# Patient Record
Sex: Male | Born: 1980 | Race: Black or African American | Hispanic: No | Marital: Single | Smoking: Former smoker
Health system: Southern US, Community
[De-identification: ages and names within clinical notes are randomized; demographics above are authoritative.]

## PROBLEM LIST (undated history)

## (undated) DIAGNOSIS — I10 Essential (primary) hypertension: Secondary | ICD-10-CM

---

## 2009-10-31 HISTORY — PX: ABDOMINAL SURGERY: SHX537

## 2009-10-31 HISTORY — PX: OTHER SURGICAL HISTORY: SHX169

## 2014-07-30 ENCOUNTER — Encounter (HOSPITAL_COMMUNITY): Payer: Self-pay | Admitting: Emergency Medicine

## 2014-07-30 ENCOUNTER — Emergency Department (INDEPENDENT_AMBULATORY_CARE_PROVIDER_SITE_OTHER)
Admission: EM | Admit: 2014-07-30 | Discharge: 2014-07-30 | Disposition: A | Discharge status: Home / Self Care | Payer: Self-pay | Source: Home / Self Care | Attending: Emergency Medicine | Admitting: Emergency Medicine

## 2014-07-30 DIAGNOSIS — I10 Essential (primary) hypertension: Secondary | ICD-10-CM

## 2014-07-30 HISTORY — DX: Essential (primary) hypertension: I10

## 2014-07-30 MED ORDER — LISINOPRIL 10 MG PO TABS: 10.0000 mg | ORAL_TABLET | Freq: Every day | ORAL | Status: DC

## 2014-07-30 MED ORDER — HYDROCHLOROTHIAZIDE 25 MG PO TABS: 25.0000 mg | ORAL_TABLET | Freq: Every day | ORAL | Status: DC

## 2014-07-30 NOTE — Discharge Instructions (Signed)
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
## 2014-07-30 NOTE — ED Notes (Signed)
Resident of [facility].  Ran out of BP medicine medicine 2 days ago.

## 2014-07-30 NOTE — ED Provider Notes (Signed)
Medical screening examination/treatment/procedure(s) were performed by non-physician practitioner and as supervising physician I was immediately available for consultation/collaboration.  Leslee Homeavid Walt Geathers, M.D.  Reuben Likesavid C Ludie Hudon, MD 07/30/14 2206

## 2014-09-05 ENCOUNTER — Emergency Department (INDEPENDENT_AMBULATORY_CARE_PROVIDER_SITE_OTHER)
Admission: EM | Admit: 2014-09-05 | Discharge: 2014-09-05 | Disposition: A | Discharge status: Nursing Home (Includes ICF) | Payer: Self-pay | Source: Home / Self Care | Attending: Family Medicine | Admitting: Family Medicine

## 2014-09-05 ENCOUNTER — Emergency Department (HOSPITAL_COMMUNITY)
Admission: EM | Admit: 2014-09-05 | Discharge: 2014-09-06 | Disposition: A | Discharge status: Home / Self Care | Payer: Self-pay | Attending: Emergency Medicine | Admitting: Emergency Medicine

## 2014-09-05 ENCOUNTER — Encounter (HOSPITAL_COMMUNITY): Payer: Self-pay | Admitting: *Deleted

## 2014-09-05 ENCOUNTER — Emergency Department (HOSPITAL_COMMUNITY): Payer: Self-pay

## 2014-09-05 ENCOUNTER — Encounter (HOSPITAL_COMMUNITY): Payer: Self-pay | Admitting: Emergency Medicine

## 2014-09-05 DIAGNOSIS — M958 Other specified acquired deformities of musculoskeletal system: Secondary | ICD-10-CM

## 2014-09-05 DIAGNOSIS — R52 Pain, unspecified: Secondary | ICD-10-CM

## 2014-09-05 DIAGNOSIS — R1031 Right lower quadrant pain: Secondary | ICD-10-CM

## 2014-09-05 DIAGNOSIS — M79632 Pain in left forearm: Secondary | ICD-10-CM

## 2014-09-05 DIAGNOSIS — I10 Essential (primary) hypertension: Secondary | ICD-10-CM

## 2014-09-05 DIAGNOSIS — S51832S Puncture wound without foreign body of left forearm, sequela: Secondary | ICD-10-CM

## 2014-09-05 DIAGNOSIS — Z7901 Long term (current) use of anticoagulants: Secondary | ICD-10-CM | POA: Insufficient documentation

## 2014-09-05 DIAGNOSIS — IMO0002 Reserved for concepts with insufficient information to code with codable children: Secondary | ICD-10-CM

## 2014-09-05 DIAGNOSIS — M79602 Pain in left arm: Secondary | ICD-10-CM

## 2014-09-05 DIAGNOSIS — F19230 Other psychoactive substance dependence with withdrawal, uncomplicated: Secondary | ICD-10-CM

## 2014-09-05 DIAGNOSIS — F19930 Other psychoactive substance use, unspecified with withdrawal, uncomplicated: Secondary | ICD-10-CM

## 2014-09-05 DIAGNOSIS — Z87891 Personal history of nicotine dependence: Secondary | ICD-10-CM | POA: Insufficient documentation

## 2014-09-05 DIAGNOSIS — S31139S Puncture wound of abdominal wall without foreign body, unspecified quadrant without penetration into peritoneal cavity, sequela: Secondary | ICD-10-CM

## 2014-09-05 DIAGNOSIS — W3400XS Accidental discharge from unspecified firearms or gun, sequela: Secondary | ICD-10-CM

## 2014-09-05 DIAGNOSIS — Y249XXS Unspecified firearm discharge, undetermined intent, sequela: Secondary | ICD-10-CM | POA: Insufficient documentation

## 2014-09-05 DIAGNOSIS — Z79899 Other long term (current) drug therapy: Secondary | ICD-10-CM | POA: Insufficient documentation

## 2014-09-05 DIAGNOSIS — S51002S Unspecified open wound of left elbow, sequela: Secondary | ICD-10-CM | POA: Insufficient documentation

## 2014-09-05 DIAGNOSIS — S31603S Unspecified open wound of abdominal wall, right lower quadrant with penetration into peritoneal cavity, sequela: Secondary | ICD-10-CM | POA: Insufficient documentation

## 2014-09-05 LAB — COMPREHENSIVE METABOLIC PANEL
ALK PHOS: 68 U/L (ref 39–117)
ALT: 13 U/L (ref 0–53)
AST: 21 U/L (ref 0–37)
Albumin: 3.9 g/dL (ref 3.5–5.2)
Anion gap: 14 (ref 5–15)
BUN: 10 mg/dL (ref 6–23)
CO2: 23 mEq/L (ref 19–32)
Calcium: 9.7 mg/dL (ref 8.4–10.5)
Chloride: 103 mEq/L (ref 96–112)
Creatinine, Ser: 1 mg/dL (ref 0.50–1.35)
GFR calc non Af Amer: >90 mL/min (ref >90)
GLUCOSE: 131 mg/dL — AB (ref 70–99)
POTASSIUM: 4.1 meq/L (ref 3.7–5.3)
Sodium: 140 mEq/L (ref 137–147)
TOTAL PROTEIN: 7.3 g/dL (ref 6.0–8.3)
Total Bilirubin: 0.5 mg/dL (ref 0.3–1.2)

## 2014-09-05 LAB — CBC WITH DIFFERENTIAL/PLATELET
BASOS ABS: 0 10*3/uL (ref 0.0–0.1)
Basophils Relative: 0 % (ref 0–1)
Eosinophils Absolute: 0.2 10*3/uL (ref 0.0–0.7)
Eosinophils Relative: 3 % (ref 0–5)
HCT: 39.1 % (ref 39.0–52.0)
Hemoglobin: 13.7 g/dL (ref 13.0–17.0)
LYMPHS ABS: 2.6 10*3/uL (ref 0.7–4.0)
LYMPHS PCT: 37 % (ref 12–46)
MCH: 28.4 pg (ref 26.0–34.0)
MCHC: 35 g/dL (ref 30.0–36.0)
MCV: 81.1 fL (ref 78.0–100.0)
Monocytes Absolute: 0.6 10*3/uL (ref 0.1–1.0)
Monocytes Relative: 8 % (ref 3–12)
NEUTROS PCT: 52 % (ref 43–77)
Neutro Abs: 3.6 10*3/uL (ref 1.7–7.7)
PLATELETS: 246 10*3/uL (ref 150–400)
RBC: 4.82 MIL/uL (ref 4.22–5.81)
RDW: 12.9 % (ref 11.5–15.5)
WBC: 6.9 10*3/uL (ref 4.0–10.5)

## 2014-09-05 LAB — URINALYSIS, ROUTINE W REFLEX MICROSCOPIC
Bilirubin Urine: NEGATIVE
Glucose, UA: NEGATIVE mg/dL
HGB URINE DIPSTICK: NEGATIVE
Ketones, ur: NEGATIVE mg/dL
Leukocytes, UA: NEGATIVE
Nitrite: NEGATIVE
PH: 5.5 (ref 5.0–8.0)
Protein, ur: NEGATIVE mg/dL
Specific Gravity, Urine: 1.034 — ABNORMAL HIGH (ref 1.005–1.030)
UROBILINOGEN UA: 1 mg/dL (ref 0.0–1.0)

## 2014-09-05 MED ORDER — HYDROCODONE-ACETAMINOPHEN 5-325 MG PO TABS: 1.0000 | ORAL_TABLET | Freq: Once | ORAL | Status: DC

## 2014-09-05 MED ORDER — OXYCODONE-ACETAMINOPHEN 5-325 MG PO TABS
1.0000 | ORAL_TABLET | Freq: Once | ORAL | Status: AC
  Administered 2014-09-05: 1 via ORAL
  Filled 2014-09-05: qty 1

## 2014-09-05 MED ORDER — LISINOPRIL-HYDROCHLOROTHIAZIDE 10-12.5 MG PO TABS: 1.0000 | ORAL_TABLET | Freq: Every day | ORAL | Status: DC

## 2014-09-05 MED ORDER — IBUPROFEN 800 MG PO TABS: 800.0000 mg | ORAL_TABLET | Freq: Three times a day (TID) | ORAL | Status: DC | PRN

## 2014-09-05 MED ORDER — HYDROCODONE-ACETAMINOPHEN 5-325 MG PO TABS: 1.0000 | ORAL_TABLET | ORAL | Status: DC | PRN

## 2014-09-05 NOTE — ED Notes (Signed)
Discussed with patient that urine sample is needed.

## 2014-09-05 NOTE — Discharge Instructions (Signed)
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
## 2014-09-06 NOTE — Discharge Instructions (Signed)
Read the information below.  Use the prescribed medication as directed.  Please discuss all new medications with your pharmacist.  Do not take additional tylenol while taking the prescribed pain medication to avoid overdose.  You may return to the Emergency Department at any time for worsening condition or any new symptoms that concern you.  If you develop high fevers, worsening abdominal pain, uncontrolled vomiting, if you cannot pass gas, or are unable to tolerate fluids by mouth, return to the ER for a recheck.  If you develop uncontrolled pain, weakness or numbness of the extremity, severe discoloration of the skin, or you are unable to move your hand or use your arm, return to the ER for a recheck.      Abdominal Pain Many things can cause abdominal pain. Usually, abdominal pain is not caused by a disease and will improve without treatment. It can often be observed and treated at home. Your health care provider will do a physical exam and possibly order blood tests and X-rays to help determine the seriousness of your pain. However, in many cases, more time must pass before a clear cause of the pain can be found. Before that point, your health care provider may not know if you need more testing or further treatment. HOME CARE INSTRUCTIONS  Monitor your abdominal pain for any changes. The following actions may help to alleviate any discomfort you are experiencing:  Only take over-the-counter or prescription medicines as directed by your health care provider.  Do not take laxatives unless directed to do so by your health care provider.  Try a clear liquid diet (broth, tea, or water) as directed by your health care provider. Slowly move to a bland diet as tolerated. SEEK MEDICAL CARE IF:  You have unexplained abdominal pain.  You have abdominal pain associated with nausea or diarrhea.  You have pain when you urinate or have a bowel movement.  You experience abdominal pain that wakes you in the  night.  You have abdominal pain that is worsened or improved by eating food.  You have abdominal pain that is worsened with eating fatty foods.  You have a fever. SEEK IMMEDIATE MEDICAL CARE IF:   Your pain does not go away within 2 hours.  You keep throwing up (vomiting).  Your pain is felt only in portions of the abdomen, such as the right side or the left lower portion of the abdomen.  You pass bloody or black tarry stools. MAKE SURE YOU:  Understand these instructions.   Will watch your condition.   Will get help right away if you are not doing well or get worse.  Document Released: 07/27/2005 Document Revised: 10/22/2013 Document Reviewed: 06/26/2013 [facility] Patient Information 2015 [place], [place]. This information is not intended to replace advice given to you by your health care provider. Make sure you discuss any questions you have with your health care provider.  Musculoskeletal Pain Musculoskeletal pain is muscle and boney aches and pains. These pains can occur in any part of the body. Your caregiver may treat you without knowing the cause of the pain. They may treat you if blood or urine tests, X-rays, and other tests were normal.  CAUSES There is often not a definite cause or reason for these pains. These pains may be caused by a type of germ (virus). The discomfort may also come from overuse. Overuse includes working out too hard when your body is not fit. Boney aches also come from weather changes. Bone is  sensitive to atmospheric pressure changes. HOME CARE INSTRUCTIONS   Ask when your test results will be ready. Make sure you get your test results.  Only take over-the-counter or prescription medicines for pain, discomfort, or fever as directed by your caregiver. If you were given medications for your condition, do not drive, operate machinery or power tools, or sign legal documents for 24 hours. Do not drink alcohol. Do not take sleeping pills or other  medications that may interfere with treatment.  Continue all activities unless the activities cause more pain. When the pain lessens, slowly resume normal activities. Gradually increase the intensity and duration of the activities or exercise.  During periods of severe pain, bed rest may be helpful. Lay or sit in any position that is comfortable.  Putting ice on the injured area.  Put ice in a bag.  Place a towel between your skin and the bag.  Leave the ice on for 15 to 20 minutes, 3 to 4 times a day.  Follow up with your caregiver for continued problems and no reason can be found for the pain. If the pain becomes worse or does not go away, it may be necessary to repeat tests or do additional testing. Your caregiver may need to look further for a possible cause. SEEK IMMEDIATE MEDICAL CARE IF:  You have pain that is getting worse and is not relieved by medications.  You develop chest pain that is associated with shortness or breath, sweating, feeling sick to your stomach (nauseous), or throw up (vomit).  Your pain becomes localized to the abdomen.  You develop any new symptoms that seem different or that concern you. MAKE SURE YOU:   Understand these instructions.  Will watch your condition.  Will get help right away if you are not doing well or get worse. Document Released: 10/17/2005 Document Revised: 01/09/2012 Document Reviewed: 06/21/2013 [facility] Patient Information 2015 [place], [place]. This information is not intended to replace advice given to you by your health care provider. Make sure you discuss any questions you have with your health care provider.    Emergency Department Resource Guide 1) Find a Doctor and Pay Out of Pocket Although you won't have to find out who is covered by your insurance plan, it is a good idea to ask around and get recommendations. You will then need to call the office and see if the doctor you have chosen will accept you as a new patient and  what types of options they offer for patients who are self-pay. Some doctors offer discounts or will set up payment plans for their patients who do not have insurance, but you will need to ask so you aren't surprised when you get to your appointment.  2) Contact Your Local Health Department Not all health departments have doctors that can see patients for sick visits, but many do, so it is worth a call to see if yours does. If you don't know where your local health department is, you can check in your phone book. The CDC also has a tool to help you locate your state's health department, and many state websites also have listings of all of their local health departments.  3) Find a Walk-in Clinic If your illness is not likely to be very severe or complicated, you may want to try a walk in clinic. These are popping up all over the country in pharmacies, drugstores, and shopping centers. They're usually staffed by nurse practitioners or physician assistants that have been trained  to treat common illnesses and complaints. They're usually fairly quick and inexpensive. However, if you have serious medical issues or chronic medical problems, these are probably not your best option.  No Primary Care Doctor: - Call Health Connect at  [phone] - they can help you locate a primary care doctor that  accepts your insurance, provides certain services, etc. - Physician Referral Service- [phone]  Chronic Pain Problems: Organization         Address  Phone   Notes  [facility]  [phone]) [phone] Patients need to be referred by their primary care doctor.   Medication Assistance: Organization         Address  Phone   Notes  [facility] Medication [facility] [address] [place], [place] [address] [phone] --Must be a resident of [facility] -- Must have NO insurance coverage whatsoever (no Medicaid/ Medicare, etc.) -- The pt. MUST have a primary care doctor  that directs their care regularly and follows them in the community   MedAssist  [phone]) [phone]   [employer]  [phone]    Agencies that provide inexpensive medical care: Organization         Address  Phone   Notes  [facility]  [phone]   [facility]    [phone]   [facility] [address] [place], [place] [address] [phone]   [facility] [address], [place] [phone]   Planned Parenthood    [phone]) [phone]   [facility]    [phone]) [phone]   [facility] and [facility]  [address], [place] Phone:  [phone], Fax:  [phone]) [phone] Hours of Operation:  9 am - 6 pm, M-F.  Also accepts Medicaid/Medicare and self-pay.  [facility]  [address], [place] Phone: [phone], Fax: [phone]. Hours of Operation:  8:30 am - 5:30 pm, M-F.  Also accepts Medicaid and self-pay.  [facility] [place] [address], [place] Phone: [phone]) [phone]   [facility] [address], [place]ra Laffey [place], [place] [phone], Ext. [phone] Mondays & Thursdays: 7-9 AM.  First 15 patients are seen on a first come, first serve basis.    Medicaid-accepting [facility] Providers:  Organization         Address  Phone   Notes  [facility] [address],  [phone] Also accepts self-pay patients.  [facility] [address], [place]  [phone]   [facility] [address], [place] [phone]   [facility] [address] [place] [address], [place] [phone]) [phone]   Renaye RakersVeita Bland [address], [place]   [phone] Only accepts [place] Access [place] patients after they have their name applied to their card.   Self-Pay (no insurance) in [facility]:  Organization          Address  Phone   Notes  Sickle Cell Patients, [facility] [address], [place] [phone]   [facility] [address], [place] [phone]   [facility] [place]  [address], [place] [phone]) [phone]   [facility]/Dr. Osei-Bonsu  [address] [place] [address], [place] or [phone]  [address], [address], [place] [phone] Phone number for both [facility] and [place] locations is the same.  [facility] [address] [place] [address], [place] [phone]   [facility] [address], [place] or [address] [phone]   [facility] [address], [place] [phone], phone; [phone], fax Sees patients 1st and 3rd Saturday of every month.  Must not qualify for public or private insurance (i.e. Medicaid, Medicare, [place] Health Choice, Veterans' Benefits)  Household income should be no more than 200% of the poverty level The clinic cannot treat you if you are pregnant or think you are pregnant  Sexually transmitted diseases are not treated at the clinic.    Dental Care: Organization         Address  Phone  Notes  [facility] of [facility] [address] [place], [place] [phone] Accepts children up to age 94 who are enrolled in [place] or [place] Health Choice; pregnant women with a Medicaid card; and children who have applied for Medicaid or [place] Health Choice, but were declined, whose parents can pay a reduced fee at time of service.  [facility] Department of [facility]  [address], [place] [phone] Accepts children up to age 72 who are enrolled in [place] or [place] Health Choice; pregnant women with a Medicaid card; and children who have applied for Medicaid or [place] Health Choice, but were declined, whose parents can pay a reduced fee at time of  service.  [facility]  [address] [place], [place] [phone] Patients are seen by appointment only. Walk-ins are not accepted. [facility] will see patients 17 years of age and older. Monday - Tuesday (8am-5pm) Most Wednesdays (8:30-5pm) $30 per visit, cash only  [facility] Adult Dental Access PROGRAM  [address], [facility] [phone] Patients are seen by appointment only. Walk-ins are not accepted. [facility] will see patients 43 years of age and older. One Wednesday Evening (Monthly: Volunteer Based).  $30 per visit, cash only  [employer]  [phone] for adults; Children under age 77, call Graduate Pediatric Dentistry at [phone]. Children aged 34-14, please call [phone] to request a pediatric application.  Dental services are provided in all areas of dental care including fillings, crowns and bridges, complete and partial dentures, implants, gum treatment, root canals, and extractions. Preventive care is also provided. Treatment is provided to both adults and children. Patients are selected via a lottery and there is often a waiting list.   [facility] [address], [place]  [phone] [email or web address]   [facility] [address] [place], [place] [phone], Ext. [phone] Second and Fourth Thursday of each month, opens at 6:30 AM; Clinic ends at 9 AM.  Patients are seen on a first-come first-served basis, and a limited number are seen during each clinic.   [facility]  [address] Griff[address] [place], [place] [phone]   Eligibility Requirements You must have lived in [place], [place], or [place] counties for at least the last three months.   You cannot be eligible for state or federal sponsored [place], including [employer], [place], or [employer].   You generally cannot be eligible for healthcare insurance through your employer.     How to apply: Eligibility screenings are held  every Tuesday and Wednesday afternoon from 1:00 pm until 4:00 pm. You do not need an appointment for the interview!  [facility] [address], [place], [place] [phone]   [facility]  [phone]   [facility]  [phone]   [facility]  [phone]    Behavioral Health Resources in the Community: Intensive Outpatient Programs Organization         Address  Phone  Notes  [facility] [address], [place], [place] [phone]   [facility] Outpatient [address], [place], [place] [phone]   [facility] [address], [place], [place]  [phone]   [facility] [address],  [place], [place] [phone] or [phone]   Substance Abuse Resources Organization         Address  Phone  Notes  [facility]  [phone]   [facility]  [phone]   [facility]  [phone]   [facility]  [phone]   Residential & Outpatient Substance Abuse Program  [phone]   Psychological Services Organization         Address  Phone  Notes  [facility]  [phone]- [phone]   [facility]  [phone]- [phone]   [facility] [address], [place] [phone] or [phone]    Mobile Crisis Teams Organization         Address  Phone  Notes  [facility], Mobile Crisis Care Unit  [phone]   [facility]  [address] [place], [place] [phone]   Doristine LocksSharon DeEsch [address] [place] [phone]    Self-Help/Support Groups Organization         Address  Phone             Notes  [facility]oc[facility] of [place] - variety of support groups  336- [phone] Call for more information  Narcotics Anonymous (NA), [facility] [address], [place]  2 meetings at this location   Statisticianesidential Treatment Programs Organization         Address  Phone  Notes  [facility] [address],    [place]  [phone]   [facility]  [address], [place] [phone], [place], [place] [phone]   [facility] [address], [place] [phone] Admissions: 8am-3pm M-F  [facility] [address]    [place], [place] [phone]   [facility] [address], [place], [place] [phone]   [facility] [address]  [place], [place] [phone]   [facility] - Intensive Outpatient [address], [place], [place] [phone]   [facility] ([facility] [address]  [place], [place] [phone] or [phone]   [facility] ([facility]) [address] [place], [place] [phone] Accepts Medicaid  [facility] [address]  [place] [phone] Substance Abuse/Addiction Treatment   [facility] Behavioral Health Resources Organization         Address  Phone  Notes  [facility]  [phone]   Angie FavaJulie Brannon, PhD [address], [address] [place], [place]   [phone] or [phone]   [facility]   [address] [place], [place] [phone](336) [phone]   [facility] [address], [place], [place] [phone] Insurance/Medicaid/sponsorship through [facility] Families [address]                                    [place], [place] [phone] Therapy/tele-psych/case  [facility] [address]   [place], [place] [phone]    Dr. Lolly Mustache  [phone]   [facility] [place]  United [employer] [facility] [place] [facility] Dept. 1) [address] [place] 2) [address] [place] 3)  [address], [place] [phone]  [phone]   [facility] Child Abuse  Hotline [phone] or [phone] (After Hours)

## 2014-09-08 NOTE — Discharge Planning (Signed)
[facility] Community Health & Eligibility Specialist  Patients information was given to me by Idelle CrouchEmily W,PA-C, to follow up regarding establishing primary care. GCCN orange card information will be sent to the patient. New patient appointment with the [facility] established via epic.

## 2014-09-11 ENCOUNTER — Ambulatory Visit: Payer: Self-pay | Admitting: Family Medicine

## 2014-09-18 NOTE — Progress Notes (Signed)
P4[facility] Community Health & Eligibility Specialist was not able to see the patient, GCCN orange card information will be sent to the address provided. °

## 2015-04-17 ENCOUNTER — Encounter (HOSPITAL_COMMUNITY): Payer: Self-pay | Admitting: Family Medicine

## 2015-04-17 ENCOUNTER — Emergency Department (HOSPITAL_COMMUNITY)
Admission: EM | Admit: 2015-04-17 | Discharge: 2015-04-17 | Disposition: A | Discharge status: Home / Self Care | Payer: Self-pay | Attending: Emergency Medicine | Admitting: Emergency Medicine

## 2015-04-17 DIAGNOSIS — M79632 Pain in left forearm: Secondary | ICD-10-CM | POA: Insufficient documentation

## 2015-04-17 DIAGNOSIS — Z7901 Long term (current) use of anticoagulants: Secondary | ICD-10-CM | POA: Insufficient documentation

## 2015-04-17 DIAGNOSIS — Z87891 Personal history of nicotine dependence: Secondary | ICD-10-CM | POA: Insufficient documentation

## 2015-04-17 DIAGNOSIS — I10 Essential (primary) hypertension: Secondary | ICD-10-CM | POA: Insufficient documentation

## 2015-04-17 DIAGNOSIS — R109 Unspecified abdominal pain: Secondary | ICD-10-CM | POA: Insufficient documentation

## 2015-04-17 DIAGNOSIS — M1990 Unspecified osteoarthritis, unspecified site: Secondary | ICD-10-CM | POA: Insufficient documentation

## 2015-04-17 DIAGNOSIS — Z79899 Other long term (current) drug therapy: Secondary | ICD-10-CM | POA: Insufficient documentation

## 2015-04-17 MED ORDER — NAPROXEN 500 MG PO TABS: 500.0000 mg | ORAL_TABLET | Freq: Two times a day (BID) | ORAL | Status: DC

## 2015-04-17 NOTE — ED Notes (Signed)
Pt here for left FA swelling and pain . sts x a few months. sts swells every other week. Hx of surgery in that arm.

## 2015-04-17 NOTE — Discharge Instructions (Signed)
Arthritis, Nonspecific °Arthritis is inflammation of a joint. This usually means pain, redness, warmth or swelling are present. One or more joints may be involved. There are a number of types of arthritis. Your caregiver may not be able to tell what type of arthritis you have right away. °CAUSES  °The most common cause of arthritis is the wear and tear on the joint (osteoarthritis). This causes damage to the cartilage, which can break down over time. The knees, hips, back and neck are most often affected by this type of arthritis. °Other types of arthritis and common causes of joint pain include: °· Sprains and other injuries near the joint. Sometimes minor sprains and injuries cause pain and swelling that develop hours later. °· Rheumatoid arthritis. This affects hands, feet and knees. It usually affects both sides of your body at the same time. It is often associated with chronic ailments, fever, weight loss and general weakness. °· Crystal arthritis. Gout and pseudo gout can cause occasional acute severe pain, redness and swelling in the foot, ankle, or knee. °· Infectious arthritis. Bacteria can get into a joint through a break in overlying skin. This can cause infection of the joint. Bacteria and viruses can also spread through the blood and affect your joints. °· Drug, infectious and allergy reactions. Sometimes joints can become mildly painful and slightly swollen with these types of illnesses. °SYMPTOMS  °· Pain is the main symptom. °· Your joint or joints can also be red, swollen and warm or hot to the touch. °· You may have a fever with certain types of arthritis, or even feel overall ill. °· The joint with arthritis will hurt with movement. Stiffness is present with some types of arthritis. °DIAGNOSIS  °Your caregiver will suspect arthritis based on your description of your symptoms and on your exam. Testing may be needed to find the type of arthritis: °· Blood and sometimes urine tests. °· X-ray tests  and sometimes CT or MRI scans. °· Removal of fluid from the joint (arthrocentesis) is done to check for bacteria, crystals or other causes. Your caregiver (or a specialist) will numb the area over the joint with a local anesthetic, and use a needle to remove joint fluid for examination. This procedure is only minimally uncomfortable. °· Even with these tests, your caregiver may not be able to tell what kind of arthritis you have. Consultation with a specialist (rheumatologist) may be helpful. °TREATMENT  °Your caregiver will discuss with you treatment specific to your type of arthritis. If the specific type cannot be determined, then the following general recommendations may apply. °Treatment of severe joint pain includes: °· Rest. °· Elevation. °· Anti-inflammatory medication (for example, ibuprofen) may be prescribed. Avoiding activities that cause increased pain. °· Only take over-the-counter or prescription medicines for pain and discomfort as recommended by your caregiver. °· Cold packs over an inflamed joint may be used for 10 to 15 minutes every hour. Hot packs sometimes feel better, but do not use overnight. Do not use hot packs if you are diabetic without your caregiver's permission. °· A cortisone shot into arthritic joints may help reduce pain and swelling. °· Any acute arthritis that gets worse over the next 1 to 2 days needs to be looked at to be sure there is no joint infection. °Long-term arthritis treatment involves modifying activities and lifestyle to reduce joint stress jarring. This can include weight loss. Also, exercise is needed to nourish the joint cartilage and remove waste. This helps keep the muscles   around the joint strong. °HOME CARE INSTRUCTIONS  °· Do not take aspirin to relieve pain if gout is suspected. This elevates uric acid levels. °· Only take over-the-counter or prescription medicines for pain, discomfort or fever as directed by your caregiver. °· Rest the joint as much as  possible. °· If your joint is swollen, keep it elevated. °· Use crutches if the painful joint is in your leg. °· Drinking plenty of fluids may help for certain types of arthritis. °· Follow your caregiver's dietary instructions. °· Try low-impact exercise such as: °¨ Swimming. °¨ Water aerobics. °¨ Biking. °¨ Walking. °· Morning stiffness is often relieved by a warm shower. °· Put your joints through regular range-of-motion. °SEEK MEDICAL CARE IF:  °· You do not feel better in 24 hours or are getting worse. °· You have side effects to medications, or are not getting better with treatment. °SEEK IMMEDIATE MEDICAL CARE IF:  °· You have a fever. °· You develop severe joint pain, swelling or redness. °· Many joints are involved and become painful and swollen. °· There is severe back pain and/or leg weakness. °· You have loss of bowel or bladder control. °Document Released: 11/24/2004 Document Revised: 01/09/2012 Document Reviewed: 12/10/2008 °ExitCare® Patient Information ©2015 [employer]. This information is not intended to replace advice given to you by your health care provider. Make sure you discuss any questions you have with your health care provider. ° ° °Emergency Department Resource Guide °1) Find a Doctor and Pay Out of Pocket °Although you won't have to find out who is covered by your insurance plan, it is a good idea to ask around and get recommendations. You will then need to call the office and see if the doctor you have chosen will accept you as a new patient and what types of options they offer for patients who are self-pay. Some doctors offer discounts or will set up payment plans for their patients who do not have insurance, but you will need to ask so you aren't surprised when you get to your appointment. ° °2) Contact Your Local Health Department °Not all health departments have doctors that can see patients for sick visits, but many do, so it is worth a call to see if yours does. If you don't  know where your local health department is, you can check in your phone book. The CDC also has a tool to help you locate your state's health department, and many state websites also have listings of all of their local health departments. ° °3) Find a Walk-in Clinic °If your illness is not likely to be very severe or complicated, you may want to try a walk in clinic. These are popping up all over the country in pharmacies, drugstores, and shopping centers. They're usually staffed by nurse practitioners or physician assistants that have been trained to treat common illnesses and complaints. They're usually fairly quick and inexpensive. However, if you have serious medical issues or chronic medical problems, these are probably not your best option. ° °No Primary Care Doctor: °- Call [facility] at  832-8000 - they can help you locate a primary care doctor that  accepts your insurance, provides certain services, etc. °- Physician Referral Service- 1-800-533-3463 ° °Chronic Pain Problems: °Organization         Address  Phone   Notes  °[facility]  (336) 297-2271 Patients need to be referred by their primary care doctor.  ° °Medication Assistance: °Organization           Address  Phone   Notes  °[place] [facility] [address] °[place], [place] [address] (336) 641-8030 --Must be a resident of [place] °-- Must have NO insurance coverage whatsoever (no Medicaid/ Medicare, etc.) °-- The pt. MUST have a primary care doctor that directs their care regularly and follows them in the community °  °[facility]  (866) 331-1348   °[employer]  (888) 892-1162   ° °Agencies that provide inexpensive medical care: °Organization         Address  Phone   Notes  °[facility]  (336) 832-8035   °[facility]    (336) 832-7272   °[facility] [address] °[place], [place] [address] (336) 832-4777   °[facility] [address], °[place] (336) 271-4999   °[facility]    (336) 373-0678   °[facility]    (336) 272-1050   °[facility] ° [address], [place] Phone:  (336) 832-4444, Fax:  (336) 832-4440 Hours of Operation:  9 am - 6 pm, M-F.  Also accepts Medicaid/Medicare and self-pay.  °[facility] ° [address], [place] Phone: (336) 832-3150, Fax: (336) 832-3151. Hours of Operation:  8:30 am - 5:30 pm, M-F.  Also accepts Medicaid and self-pay.  °[facility] [place] [address], [place] Phone: (336) 878-6027   °[facility] [address], [place], [place] (336)723-1848, Ext. 123 Mondays & Thursdays: 7-9 AM.  First 15 patients are seen on a first come, first serve basis. °  ° °Medicaid-accepting [place] Providers: ° °Organization         Address  Phone   Notes  °[facility] [address], [place] (336) 641-2100 Also accepts self-pay patients.  °[facility] [address], [place] ° (336) 856-9996   °[facility] [address], [place] (336) 288-8857   °[facility] [address] [place] [address], [place] (336) 299-7000   °Veita Bland [address], [place]  ° (336) 373-1557 Only accepts [place] Access Medicaid patients after they have their name applied to their card.  ° °Self-Pay (no insurance) in [place]: ° °Organization         Address  Phone   Notes  °Sickle Cell Patients, [facility] [address], [place] (336) 832-1970   °[facility] [address], [place] (336) 832-4400   °[facility] ° [address], [place] (336) 992-4800   °[facility]/Dr. Osei-Bonsu ° [address] [place] [address], [place] or [address], [place] (336) 841-8500 Phone  number for both [place] and [place] locations is the same.  °[facility] [address], [place] (336) 299-0000   °[facility] [address] [place] [address], [place] or [address] (336) 852-7530 °(336) 878-2260   °[facility] [address], [place] (336) 350-1642, phone; (336) 294-5005, fax Sees patients 1st and 3rd Saturday of every month.  Must not qualify for public or private insurance (i.e. Medicaid, Medicare,  Health Choice, Veterans' Benefits) • Household income should be no more than 200% of the poverty level •The clinic cannot treat you if you are pregnant or think you   are pregnant • Sexually transmitted diseases are not treated at the clinic.  ° ° °Dental Care: °Organization         Address  Phone  Notes  °[facility] [address], [place] (336) 641-6152 Accepts children up to age 21 who are enrolled in Medicaid or [place] Health Choice; pregnant women with a Medicaid card; and children who have applied for Medicaid or [place] Health Choice, but were declined, whose parents can pay a reduced fee at time of service.  °[facility] [place]  [address], [place] (336) 641-7733 Accepts children up to age 21 who are enrolled in Medicaid or [place] Health Choice; pregnant women with a Medicaid card; and children who have applied for Medicaid or [place] Health Choice, but were declined, whose parents can pay a reduced fee at time of service.  °[facility] ° [address], [place] (336) 641-4533 Patients are seen by appointment only. Walk-ins are not accepted. [facility] will see patients 18 years of age and older. °Monday - Tuesday (8am-5pm) °Most Wednesdays (8:30-5pm) °$30 per visit, cash only  °[facility] ° [address], [place] (336) 641-4533 Patients are seen by appointment only.  Walk-ins are not accepted. [facility] will see patients 18 years of age and older. °One Wednesday Evening (Monthly: Volunteer Based).  $30 per visit, cash only  °[facility]  (919) 537-3737 for adults; Children under age 4, call Graduate Pediatric Dentistry at (919) [phone]. Children aged 4-14, please call (919) [phone] to request a pediatric application. ° Dental services are provided in all areas of dental care including fillings, crowns and bridges, complete and partial dentures, implants, gum treatment, root canals, and extractions. Preventive care is also provided. Treatment is provided to both adults and children. °Patients are selected via a lottery and there is often a waiting list. °  °[facility] [address], °[place] ° (336) 763-8833 [email or web address] °  °[facility] [address], [place], [place] (336)723-1848, Ext. 123 Second and Fourth Thursday of each month, opens at 6:30 AM; Clinic ends at 9 AM.  Patients are seen on a first-come first-served basis, and a limited number are seen during each clinic.  ° °[facility] ° [address], [place], [place] (336) 723-7904   Eligibility Requirements °You must have lived in [place], [place], or [place] counties for at least the last three months. °  You cannot be eligible for state or federal sponsored healthcare insurance, including Veterans Administration, Medicaid, or Medicare. °  You generally cannot be eligible for healthcare insurance through your employer.  °  How to apply: °Eligibility screenings are held every Tuesday and Wednesday afternoon from 1:00 pm until 4:00 pm. You do not need an appointment for the interview!  °[facility] [address], [place], [place] 336-631-2330   °[facility]  336-342-8273   °[facility]  336-703-3100   °[facility]  336-570-6415   ° °Behavioral Health  Resources in the Community: °Intensive Outpatient Programs °Organization         Address  Phone  Notes  °[facility] [address], [place], [place] 336-878-6098   °[facility] [address], [place], [place] 336-832-9800   °[facility] [address], [place], [place] ° 336-882-2125   °[facility] [address],  °[place], [place] 1-800-853-5163 or 336-641-4981   °Substance Abuse Resources °Organization         Address  Phone  Notes  °[facility]  336-882-2125   °[facility]  336-784-9470   °[facility]  336-285-9073   °[facility]  336-845-3988   °Residential & Outpatient Substance Abuse Program  1-800-659-3381   °Psychological Services °Organization         Address  Phone  Notes  °[facility]  336- 832-9600   °[facility]  336- 378-7881   °[facility] [address], [place] 1-800-853-5163 or 336-641-4981   ° °Mobile Crisis Teams °Organization         Address  Phone  Notes  °[facility], Mobile Crisis Care Unit  1-877-626-1772   °Assertive °[facility] ° [address] [place], [place] 336-834-9664   °Sharon DeEsch [address] °[place] 336-554-5454   ° °Self-Help/Support Groups °Organization         Address  Phone             Notes  °[facility] - variety of support groups  336- 373-1402 Call for more information  °Narcotics Anonymous (NA), [facility] [address], °[place]  2 meetings at this location  ° °Residential Treatment Programs °Organization         Address  Phone  Notes  °[facility] [address],    °[place]  1-866-801-8205   °[facility] ° [address], [place], [place] 704-293-8524   °[facility] [address], [place] 336-845-3988 Admissions: 8am-3pm M-F  °[facility] [address]    °[place], [place] 336-841-1104   °[facility] [address], [place], [place] 336-379-7146   °[facility] [address]  °[place], [place] 336-285-9073   °[facility] Intensive Outpatient [address], [place], [place] 336-852-3033   °[facility] [address]  °[place], [place] 1-877-615-2722 or 336-784-9470   °[facility] [address] [place], [place] 336-227-7417 Accepts Medicaid  °[facility] [address]  °[place] 1-800-659-3381 Substance Abuse/Addiction Treatment  ° °[place] Behavioral Health Resources °Organization         Address  Phone  Notes  °[facility]  (888) 581-9988   °Julie Brannon, PhD [address], [place]   (336) 349-5553 or (336) 951-0000   °[facility]   [address] °[place], [place] (336) 349-4454   °[facility] [address], [place], [place] (336) 342-8316 Insurance/Medicaid/sponsorship through [facility]  °[facility] [address]                                    [place], [place] (336) 342-8316 Therapy/tele-psych/case  °[facility] [address]  ° [place], [place] (336) 349-2233    °Dr. Arfeen  (336) 349-4544   °[facility] [place]  [employer] [facility] [place] [facility] 1) [address], [place] °2) [address], [place] °3)  [address], [place] (336) 349-3220 °(336) 342-7768 ° °(336) 342-8140   °[place] [facility] (336)   342-1394 or (336) 342-3537 (After Hours)    ° ° ° °
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
## 2015-05-18 ENCOUNTER — Emergency Department (HOSPITAL_COMMUNITY)
Admission: EM | Admit: 2015-05-18 | Discharge: 2015-05-18 | Disposition: A | Discharge status: Home / Self Care | Payer: Self-pay | Attending: Emergency Medicine | Admitting: Emergency Medicine

## 2015-05-18 ENCOUNTER — Encounter (HOSPITAL_COMMUNITY): Payer: Self-pay | Admitting: *Deleted

## 2015-05-18 ENCOUNTER — Emergency Department (HOSPITAL_COMMUNITY)
Admission: EM | Admit: 2015-05-18 | Discharge: 2015-05-18 | Discharge status: Absent without leave | Payer: Self-pay | Source: Home / Self Care

## 2015-05-18 DIAGNOSIS — Z791 Long term (current) use of non-steroidal anti-inflammatories (NSAID): Secondary | ICD-10-CM | POA: Insufficient documentation

## 2015-05-18 DIAGNOSIS — I10 Essential (primary) hypertension: Secondary | ICD-10-CM | POA: Insufficient documentation

## 2015-05-18 DIAGNOSIS — R51 Headache: Secondary | ICD-10-CM | POA: Insufficient documentation

## 2015-05-18 DIAGNOSIS — Z79899 Other long term (current) drug therapy: Secondary | ICD-10-CM | POA: Insufficient documentation

## 2015-05-18 DIAGNOSIS — R519 Headache, unspecified: Secondary | ICD-10-CM

## 2015-05-18 DIAGNOSIS — Z87891 Personal history of nicotine dependence: Secondary | ICD-10-CM | POA: Insufficient documentation

## 2015-05-18 MED ORDER — LISINOPRIL-HYDROCHLOROTHIAZIDE 10-12.5 MG PO TABS: 1.0000 | ORAL_TABLET | Freq: Every day | ORAL | Status: DC

## 2015-05-18 NOTE — ED Notes (Signed)
Pt called three times for triage with no answer
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
## 2015-05-18 NOTE — ED Notes (Signed)
Pt states that he is out of his bp medications and has a headache. No neuro deficits in triage

## 2015-05-18 NOTE — ED Notes (Signed)
Pt found and placed in triage room 4 pt was in restroom at time of being called for triage previously.

## 2015-05-30 ENCOUNTER — Emergency Department (HOSPITAL_COMMUNITY)
Admission: EM | Admit: 2015-05-30 | Discharge: 2015-05-30 | Disposition: A | Discharge status: Home / Self Care | Payer: Self-pay | Attending: Emergency Medicine | Admitting: Emergency Medicine

## 2015-05-30 ENCOUNTER — Encounter (HOSPITAL_COMMUNITY): Payer: Self-pay | Admitting: Emergency Medicine

## 2015-05-30 DIAGNOSIS — M545 Low back pain, unspecified: Secondary | ICD-10-CM

## 2015-05-30 DIAGNOSIS — Z87891 Personal history of nicotine dependence: Secondary | ICD-10-CM | POA: Insufficient documentation

## 2015-05-30 DIAGNOSIS — I10 Essential (primary) hypertension: Secondary | ICD-10-CM | POA: Insufficient documentation

## 2015-05-30 DIAGNOSIS — Z79899 Other long term (current) drug therapy: Secondary | ICD-10-CM | POA: Insufficient documentation

## 2015-05-30 MED ORDER — CYCLOBENZAPRINE HCL 10 MG PO TABS: 10.0000 mg | ORAL_TABLET | Freq: Two times a day (BID) | ORAL | Status: DC | PRN
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
## 2015-05-30 NOTE — Discharge Instructions (Signed)
Back Exercises Back exercises help treat and prevent back injuries. The goal of back exercises is to increase the strength of your abdominal and back muscles and the flexibility of your back. These exercises should be started when you no longer have back pain. Back exercises include:  Pelvic Tilt. Lie on your back with your knees bent. Tilt your pelvis until the lower part of your back is against the floor. Hold this position 5 to 10 sec and repeat 5 to 10 times.  Knee to Chest. Pull first 1 knee up against your chest and hold for 20 to 30 seconds, repeat this with the other knee, and then both knees. This may be done with the other leg straight or bent, whichever feels better.  Sit-Ups or Curl-Ups. Bend your knees 90 degrees. Start with tilting your pelvis, and do a partial, slow sit-up, lifting your trunk only 30 to 45 degrees off the floor. Take at least 2 to 3 seconds for each sit-up. Do not do sit-ups with your knees out straight. If partial sit-ups are difficult, simply do the above but with only tightening your abdominal muscles and holding it as directed.  Hip-Lift. Lie on your back with your knees flexed 90 degrees. Push down with your feet and shoulders as you raise your hips a couple inches off the floor; hold for 10 seconds, repeat 5 to 10 times.  Back arches. Lie on your stomach, propping yourself up on bent elbows. Slowly press on your hands, causing an arch in your low back. Repeat 3 to 5 times. Any initial stiffness and discomfort should lessen with repetition over time.  Shoulder-Lifts. Lie face down with arms beside your body. Keep hips and torso pressed to floor as you slowly lift your head and shoulders off the floor. Do not overdo your exercises, especially in the beginning. Exercises may cause you some mild back discomfort which lasts for a few minutes; however, if the pain is more severe, or lasts for more than 15 minutes, do not continue exercises until you see your caregiver.  Improvement with exercise therapy for back problems is slow.  See your caregivers for assistance with developing a proper back exercise program. Document Released: 11/24/2004 Document Revised: 01/09/2012 Document Reviewed: 08/18/2011 [facility] Patient Information 2015 [place], [place]. This information is not intended to replace advice given to you by your health care provider. Make sure you discuss any questions you have with your health care provider.  Back Injury Prevention Back injuries can be extremely painful and difficult to heal. After having one back injury, you are much more likely to experience another later on. It is important to learn how to avoid injuring or re-injuring your back. The following tips can help you to prevent a back injury. PHYSICAL FITNESS  Exercise regularly and try to develop good tone in your abdominal muscles. Your abdominal muscles provide a lot of the support needed by your back.  Do aerobic exercises (walking, jogging, biking, swimming) regularly.  Do exercises that increase balance and strength (tai chi, yoga) regularly. This can decrease your risk of falling and injuring your back.  Stretch before and after exercising.  Maintain a healthy weight. The more you weigh, the more stress is placed on your back. For every pound of weight, 10 times that amount of pressure is placed on the back. DIET  Talk to your caregiver about how much calcium and vitamin D you need per day. These nutrients help to prevent weakening of the bones (osteoporosis). Osteoporosis  can cause broken (fractured) bones that lead to back pain. °· Include good sources of calcium in your diet, such as dairy products, green, leafy vegetables, and products with calcium added (fortified). °· Include good sources of vitamin D in your diet, such as milk and foods that are fortified with vitamin D. °· Consider taking a nutritional supplement or a multivitamin if needed. °· Stop smoking if you  smoke. °POSTURE °· Sit and stand up straight. Avoid leaning forward when you sit or hunching over when you stand. °· Choose chairs with good low back (lumbar) support. °· If you work at a desk, sit close to your work so you do not need to lean over. Keep your chin tucked in. Keep your neck drawn back and elbows bent at a right angle. Your arms should look like the letter "L." °· Sit high and close to the steering wheel when you drive. Add a lumbar support to your car seat if needed. °· Avoid sitting or standing in one position for too long. Take breaks to get up, stretch, and walk around at least once every hour. Take breaks if you are driving for long periods of time. °· Sleep on your side with your knees slightly bent, or sleep on your back with a pillow under your knees. Do not sleep on your stomach. °LIFTING, TWISTING, AND REACHING °· Avoid heavy lifting, especially repetitive lifting. If you must do heavy lifting: °¨ Stretch before lifting. °¨ Work slowly. °¨ Rest between lifts. °¨ Use carts and dollies to move objects when possible. °¨ Make several small trips instead of carrying 1 heavy load. °¨ Ask for help when you need it. °¨ Ask for help when moving big, awkward objects. °· Follow these steps when lifting: °¨ Stand with your feet shoulder-width apart. °¨ Get as close to the object as you can. Do not try to pick up heavy objects that are far from your body. °¨ Use handles or lifting straps if they are available. °¨ Bend at your knees. Squat down, but keep your heels off the floor. °¨ Keep your shoulders pulled back, your chin tucked in, and your back straight. °¨ Lift the object slowly, tightening the muscles in your legs, abdomen, and buttocks. Keep the object as close to the center of your body as possible. °¨ When you put a load down, use these same guidelines in reverse. °· Do not: °¨ Lift the object above your waist. °¨ Twist at the waist while lifting or carrying a load. Move your feet if you need to  turn, not your waist. °¨ Bend over without bending at your knees. °· Avoid reaching over your head, across a table, or for an object on a high surface. °OTHER TIPS °· Avoid wet floors and keep sidewalks clear of ice to prevent falls. °· Do not sleep on a mattress that is too soft or too hard. °· Keep items that are used frequently within easy reach. °· Put heavier objects on shelves at waist level and lighter objects on lower or higher shelves. °· Find ways to decrease your stress, such as exercise, massage, or relaxation techniques. Stress can build up in your muscles. Tense muscles are more vulnerable to injury. °· Seek treatment for depression or anxiety if needed. These conditions can increase your risk of developing back pain. °SEEK MEDICAL CARE IF: °· You injure your back. °· You have questions about diet, exercise, or other ways to prevent back injuries. °MAKE SURE YOU: °· Understand these   instructions.  Will watch your condition.  Will get help right away if you are not doing well or get worse. Document Released: 11/24/2004 Document Revised: 01/09/2012 Document Reviewed: 11/28/2011 [facility] Patient Information 2015 [place], [place]. This information is not intended to replace advice given to you by your health care provider. Make sure you discuss any questions you have with your health care provider.   Please monitor for new or worsening signs or symptoms, use medication only as directed, return as needed. Please follow-up with primary care provider for further evaluation and management.

## 2015-05-30 NOTE — ED Notes (Signed)
J Hedges, PA, in w/pt. 
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
## 2015-07-02 ENCOUNTER — Emergency Department (INDEPENDENT_AMBULATORY_CARE_PROVIDER_SITE_OTHER)
Admission: EM | Admit: 2015-07-02 | Discharge: 2015-07-02 | Disposition: A | Discharge status: Home / Self Care | Payer: Self-pay | Source: Home / Self Care | Attending: Family Medicine | Admitting: Family Medicine

## 2015-07-02 ENCOUNTER — Encounter (HOSPITAL_COMMUNITY): Payer: Self-pay | Admitting: Emergency Medicine

## 2015-07-02 DIAGNOSIS — J069 Acute upper respiratory infection, unspecified: Secondary | ICD-10-CM

## 2015-07-02 DIAGNOSIS — R609 Edema, unspecified: Secondary | ICD-10-CM

## 2015-07-02 MED ORDER — IPRATROPIUM BROMIDE 0.06 % NA SOLN: 2.0000 | Freq: Four times a day (QID) | NASAL | Status: AC
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
## 2015-07-02 NOTE — Discharge Instructions (Signed)
See dr Magnus Ivan for further arm eval.

## 2015-07-29 ENCOUNTER — Ambulatory Visit (INDEPENDENT_AMBULATORY_CARE_PROVIDER_SITE_OTHER): Payer: Self-pay | Admitting: Family Medicine

## 2015-07-29 ENCOUNTER — Encounter: Payer: Self-pay | Admitting: Family Medicine

## 2015-07-29 VITALS — BP 131/80 | HR 76 | Ht 73.0 in | Wt 254.0 lb

## 2015-07-29 DIAGNOSIS — T8484XA Pain due to internal orthopedic prosthetic devices, implants and grafts, initial encounter: Secondary | ICD-10-CM | POA: Insufficient documentation

## 2015-07-29 DIAGNOSIS — M79602 Pain in left arm: Secondary | ICD-10-CM
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
## 2015-07-29 NOTE — Assessment & Plan Note (Signed)
Most likely the cause of his underlying pain. Referral to orthopedics where they will obtain an x-ray and consider surgical exploration. Most likely will be unable to obtain an MRI of the left forearm due to underlying hardware.. Follow-up as needed

## 2015-08-06 ENCOUNTER — Ambulatory Visit (INDEPENDENT_AMBULATORY_CARE_PROVIDER_SITE_OTHER): Payer: Self-pay | Admitting: Family Medicine

## 2015-08-06 ENCOUNTER — Encounter: Payer: Self-pay | Admitting: Family Medicine

## 2015-08-06 VITALS — BP 119/80 | HR 70 | Temp 97.9°F | Resp 16 | Ht 73.0 in | Wt 252.0 lb

## 2015-08-06 DIAGNOSIS — I10 Essential (primary) hypertension: Secondary | ICD-10-CM

## 2015-08-06 DIAGNOSIS — Z23 Encounter for immunization: Secondary | ICD-10-CM

## 2015-08-06 DIAGNOSIS — T8484XD Pain due to internal orthopedic prosthetic devices, implants and grafts, subsequent encounter: Secondary | ICD-10-CM

## 2015-08-06 DIAGNOSIS — Z0000 Encounter for general adult medical examination without abnormal findings: Secondary | ICD-10-CM

## 2015-08-06 DIAGNOSIS — M79632 Pain in left forearm: Secondary | ICD-10-CM

## 2015-08-06 DIAGNOSIS — M25561 Pain in right knee: Secondary | ICD-10-CM

## 2015-08-06 LAB — COMPLETE METABOLIC PANEL WITH GFR
ALT: 14 U/L (ref 9–46)
AST: 25 U/L (ref 10–40)
Albumin: 4.5 g/dL (ref 3.6–5.1)
Alkaline Phosphatase: 59 U/L (ref 40–115)
BILIRUBIN TOTAL: 0.9 mg/dL (ref 0.2–1.2)
BUN: 14 mg/dL (ref 7–25)
CALCIUM: 9.8 mg/dL (ref 8.6–10.3)
CO2: 27 mmol/L (ref 20–31)
CREATININE: 1.03 mg/dL (ref 0.60–1.35)
Chloride: 102 mmol/L (ref 98–110)
GFR, Est African American: >89 mL/min (ref >=60)
GFR, Est Non African American: >89 mL/min (ref >=60)
Glucose, Bld: 90 mg/dL (ref 65–99)
Potassium: 4 mmol/L (ref 3.5–5.3)
Sodium: 137 mmol/L (ref 135–146)
TOTAL PROTEIN: 7.5 g/dL (ref 6.1–8.1)

## 2015-08-06 LAB — CBC WITH DIFFERENTIAL/PLATELET
BASOS ABS: 0 10*3/uL (ref 0.0–0.1)
Basophils Relative: 0 % (ref 0–1)
EOS PCT: 3 % (ref 0–5)
Eosinophils Absolute: 0.2 10*3/uL (ref 0.0–0.7)
HCT: 43.2 % (ref 39.0–52.0)
Hemoglobin: 15.2 g/dL (ref 13.0–17.0)
Lymphocytes Relative: 40 % (ref 12–46)
Lymphs Abs: 2.8 10*3/uL (ref 0.7–4.0)
MCH: 28.2 pg (ref 26.0–34.0)
MCHC: 35.2 g/dL (ref 30.0–36.0)
MCV: 80.1 fL (ref 78.0–100.0)
MONO ABS: 0.6 10*3/uL (ref 0.1–1.0)
MPV: 9.8 fL (ref 8.6–12.4)
Monocytes Relative: 9 % (ref 3–12)
Neutro Abs: 3.4 10*3/uL (ref 1.7–7.7)
Neutrophils Relative %: 48 % (ref 43–77)
PLATELETS: 255 10*3/uL (ref 150–400)
RBC: 5.39 MIL/uL (ref 4.22–5.81)
RDW: 13.5 % (ref 11.5–15.5)
WBC: 7.1 10*3/uL (ref 4.0–10.5)

## 2015-08-06 LAB — POCT URINALYSIS DIP (DEVICE)
Bilirubin Urine: NEGATIVE
Glucose, UA: NEGATIVE mg/dL
HGB URINE DIPSTICK: NEGATIVE
Ketones, ur: NEGATIVE mg/dL
LEUKOCYTES UA: NEGATIVE
NITRITE: NEGATIVE
PH: 5.5 (ref 5.0–8.0)
Protein, ur: NEGATIVE mg/dL
Urobilinogen, UA: 0.2 mg/dL (ref 0.0–1.0)

## 2015-08-06 LAB — TSH: TSH: 1.221 u[IU]/mL (ref 0.350–4.500)

## 2015-08-06 LAB — HEMOGLOBIN A1C
HEMOGLOBIN A1C: 6.4 % — AB (ref <5.7)
Mean Plasma Glucose: 137 mg/dL — ABNORMAL HIGH (ref <117)

## 2015-08-06 MED ORDER — MELOXICAM 7.5 MG PO TABS: 7.5000 mg | ORAL_TABLET | Freq: Every day | ORAL | Status: DC

## 2015-08-06 MED ORDER — LISINOPRIL-HYDROCHLOROTHIAZIDE 10-12.5 MG PO TABS: 1.0000 | ORAL_TABLET | Freq: Every day | ORAL | Status: AC

## 2015-08-06 MED ORDER — KETOROLAC TROMETHAMINE 60 MG/2ML IM SOLN
60.0000 mg | Freq: Once | INTRAMUSCULAR | Status: AC
  Administered 2015-08-06: 60 mg via INTRAMUSCULAR

## 2015-08-06 NOTE — Progress Notes (Signed)
Subjective:    Patient ID: Wesley Fox, male    DOB: 03/21/1981, 34 y.o.   MRN: [id number]  HPI  [name], a 34 year old male to establish care. Mr. Inabinet states that he has primarily been using the emergency department or urgent care for primarily needs. Patient states that he has pain to left forearm. He had a left arm surgery in 2013 at [facility] in [place], [place]. He states that left forearm pain has worsened over the past 4 months. He states that current pain intensity is 5/10 described as intermittent and aching. He denies paresthesias or weakness. He also denies any new injury. He was evaluated by [facility] medicine for this problem on 07/29/2015.    Patient is also  involving the  right knee. Onset of the symptoms was several weeks ago.  He states that he noticed the pain during exercise at the gym 2 weeks ago.  Current symptoms include pain located in the left lateral aspect of right knee, stiffness and swelling. Pain is aggravated by kneeling, lateral movements, pivoting, running, standing and walking.  Patient has had no prior knee problems. He maintains that he has not attempted any over the counter medications to alleviate current symptoms.  Past Medical History  Diagnosis Date  . Hypertension    Social History   Social History  . Marital Status: Single    Spouse Name: N/A  . Number of Children: N/A  . Years of Education: N/A   Occupational History  . Not on file.   Social History Main Topics  . Smoking status: Former Smoker    Quit date: 06/29/2014  . Smokeless tobacco: Not on file  . Alcohol Use: No  . 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 Sexual Activity: Not on file   Other Topics Concern  . Not on file   Social History Narrative   Immunization History  Administered Date(s) Administered  . Influenza,inj,Quad PF,36+ Mos 08/06/2015  . Tdap 08/06/2015  No Known Allergies Review of Systems   Constitutional: Negative for fatigue.  HENT: Negative.   Eyes: Negative.   Respiratory: Negative.   Cardiovascular: Negative.   Endocrine: Negative.   Genitourinary: Negative.   Musculoskeletal: Positive for myalgias (right knee pain and left forearm pain).  Skin: Negative.   Allergic/Immunologic: Negative.   Neurological: Negative.   Hematological: Negative.   Psychiatric/Behavioral: Negative.        Objective:   Physical Exam  Constitutional: He appears well-developed and well-nourished.  HENT:  Head: Normocephalic and atraumatic.  Right Ear: External ear normal.  Left Ear: External ear normal.  Mouth/Throat: Oropharynx is clear and moist.  Eyes: Conjunctivae and EOM are normal. Pupils are equal, round, and reactive to light.  Neck: Normal range of motion. Neck supple.  Cardiovascular: Normal rate, regular rhythm, normal heart sounds and intact distal pulses.   Pulmonary/Chest: Effort normal and breath sounds normal.  Abdominal: Soft. Bowel sounds are normal.  Musculoskeletal:       Left wrist: He exhibits swelling. He exhibits normal range of motion.       Right knee: He exhibits decreased range of motion and swelling. He exhibits no erythema and normal meniscus. Tenderness found. Lateral joint line tenderness noted. No patellar tendon tenderness noted.  Skin: Skin is warm and dry.  Psychiatric: He has a normal mood and affect. His behavior is normal. Judgment and thought content normal.  BP 119/80 mmHg  Pulse 70  Temp(Src) 97.9 F (36.6 C) (Oral)  Resp 16  Ht  (1.854 m)  Wt 252 lb (114.306 kg)  BMI 33.25 kg/m2 Assessment & Plan:  1. Essential hypertension Blood pressure is at goal on current medication regimen. Will check urine for proteinuria - lisinopril-hydrochlorothiazide (PRINZIDE,ZESTORETIC) 10-12.5 MG tablet; Take 1 tablet by mouth daily.  Dispense: 30 tablet; Refill: 5 - POCT urinalysis dipstick - COMPLETE METABOLIC PANEL WITH GFR  2. Right  knee pain Tenderness and trace edema to right lateral knee. Recommend ice therapy 20 minutes 4 times per day followed by warm moist compresses. Elevate right knee to heart level while at rest.  - meloxicam (MOBIC) 7.5 MG tablet; Take 1 tablet (7.5 mg total) by mouth daily.  Dispense: 30 tablet; Refill: 0 - Sedimentation Rate - ketorolac (TORADOL) injection 60 mg; Inject 2 mLs (60 mg total) into the muscle once.  3. Painful orthopaedic hardware, subsequent encounter Patient had left arm surgery in 2013. He states that he has periodic swelling and pain. He was evaluated by Sports Medicine, who recommended a referral to orthopedics where they will obtain and xray and consider surgical exploration.   4. Left forearm pain Refer to #3  5. Need for immunization against influenza  - Flu Vaccine QUAD 36+ mos IM (Fluarix)  6. Need for Tdap vaccination  - Tdap vaccine greater than or equal to 7yo IM  7. Routine health maintenance Recommend monthly self-testicular examination Recommend a lowfat, low carbohydrate diet divided over 5-6 small meals, increase water intake to 6-8 glasses, and 150 minutes per week of cardiovascular exercise.   - CBC with Differential - TSH - Hemoglobin A1c  RTC: 1 month for follow-up of right knee pain The patient was given clear instructions to go to ER or return to medical center if symptoms do not improve, worsen or new problems develop. The patient verbalized understanding. Will notify patient with laboratory results.  Massie Maroon, FNP

## 2015-08-06 NOTE — Patient Instructions (Addendum)
Continue anti-hypertensive medication as previously prescribed The patient is asked to make an attempt to improve diet and exercise patterns to aid in medical management of this problem.  Apply ice packs to right knee for increased pain and inflammation 20 minutes 4 times per day, followed by warm, moist compresses Start trial of Meloxicam 7.5 mg daily for pain and inflammation Hypertension Hypertension is another name for high blood pressure. High blood pressure forces your heart to work harder to pump blood. A blood pressure reading has two numbers, which includes a higher number over a lower number (example: 110/72). HOME CARE   Have your blood pressure rechecked by your doctor.  Only take medicine as told by your doctor. Follow the directions carefully. The medicine does not work as well if you skip doses. Skipping doses also puts you at risk for problems.  Do not smoke.  Monitor your blood pressure at home as told by your doctor. GET HELP IF:  You think you are having a reaction to the medicine you are taking.  You have repeat headaches or feel dizzy.  You have puffiness (swelling) in your ankles.  You have trouble with your vision. GET HELP RIGHT AWAY IF:   You get a very bad headache and are confused.  You feel weak, numb, or faint.  You get chest or belly (abdominal) pain.  You throw up (vomit).  You cannot breathe very well. MAKE SURE YOU:   Understand these instructions.  Will watch your condition.  Will get help right away if you are not doing well or get worse.   This information is not intended to replace advice given to you by your health care provider. Make sure you discuss any questions you have with your health care provider.   Document Released: 04/04/2008 Document Revised: 10/22/2013 Document Reviewed: 08/09/2013 Elsevier Interactive Patient Education 2016 [employer] Knee Pain Knee pain is a very common symptom and can have many causes. Knee  pain often goes away when you follow your health care provider's instructions for relieving pain and discomfort at home. However, knee pain can develop into a condition that needs treatment. Some conditions may include:  Arthritis caused by wear and tear (osteoarthritis).  Arthritis caused by swelling and irritation (rheumatoid arthritis or gout).  A cyst or growth in your knee.  An infection in your knee joint.  An injury that will not heal.  Damage, swelling, or irritation of the tissues that support your knee (torn ligaments or tendinitis). If your knee pain continues, additional tests may be ordered to diagnose your condition. Tests may include X-rays or other imaging studies of your knee. You may also need to have fluid removed from your knee. Treatment for ongoing knee pain depends on the cause, but treatment may include:  Medicines to relieve pain or swelling.  Steroid injections in your knee.  Physical therapy.  Surgery. HOME CARE INSTRUCTIONS  Take medicines only as directed by your health care provider.  Rest your knee and keep it raised (elevated) while you are resting.  Do not do things that cause or worsen pain.  Avoid high-impact activities or exercises, such as running, jumping rope, or doing jumping jacks.  Apply ice to the knee area:  Put ice in a plastic bag.  Place a towel between your skin and the bag.  Leave the ice on for 20 minutes, 2-3 times a day.  Ask your health care provider if you should wear an elastic knee support.  Keep a  pillow under your knee when you sleep.  Lose weight if you are overweight. Extra weight can put pressure on your knee.  Do not use any tobacco products, including cigarettes, chewing tobacco, or electronic cigarettes. If you need help quitting, ask your health care provider. Smoking may slow the healing of any bone and joint problems that you may have. SEEK MEDICAL CARE IF:  Your knee pain continues, changes, or gets  worse.  You have a fever along with knee pain.  Your knee buckles or locks up.  Your knee becomes more swollen. SEEK IMMEDIATE MEDICAL CARE IF:   Your knee joint feels hot to the touch.  You have chest pain or trouble breathing.   This information is not intended to replace advice given to you by your health care provider. Make sure you discuss any questions you have with your health care provider.   Document Released: 08/14/2007 Document Revised: 11/07/2014 Document Reviewed: 06/02/2014 Elsevier Interactive Patient Education [employer]

## 2015-08-07 ENCOUNTER — Telehealth: Payer: Self-pay | Admitting: Family Medicine

## 2015-08-07 LAB — SEDIMENTATION RATE: Sed Rate: 4 mm/hr (ref 0–15)

## 2015-08-07 NOTE — Telephone Encounter (Signed)
Reviewed labs. Hemoglobin a1C elevated at 6.4 which is indicative of prediabetes. Recommend a lowfat, low carbohydrate diet divided over 5-6 small meals, increase water intake to 6-8 glasses, and 150 minutes per week of cardiovascular exercise.   Patient will follow-up as scheduled.   Massie Maroon, FNP

## 2015-08-07 NOTE — Telephone Encounter (Signed)
Left message for patient to call back regarding labs. Thanks!  

## 2015-09-08 ENCOUNTER — Ambulatory Visit (INDEPENDENT_AMBULATORY_CARE_PROVIDER_SITE_OTHER): Payer: Self-pay | Admitting: Family Medicine

## 2015-09-08 ENCOUNTER — Encounter: Payer: Self-pay | Admitting: Family Medicine

## 2015-09-08 VITALS — BP 122/74 | HR 81 | Temp 98.0°F | Resp 20 | Ht 73.0 in | Wt 251.0 lb

## 2015-09-08 DIAGNOSIS — M79632 Pain in left forearm: Secondary | ICD-10-CM

## 2015-09-08 DIAGNOSIS — M25561 Pain in right knee: Secondary | ICD-10-CM

## 2015-09-08 DIAGNOSIS — I10 Essential (primary) hypertension: Secondary | ICD-10-CM

## 2015-09-08 NOTE — Patient Instructions (Signed)
Hypertension Hypertension, commonly called high blood pressure, is when the force of blood pumping through your arteries is too strong. Your arteries are the blood vessels that carry blood from your heart throughout your body. A blood pressure reading consists of a higher number over a lower number, such as 110/72. The higher number (systolic) is the pressure inside your arteries when your heart pumps. The lower number (diastolic) is the pressure inside your arteries when your heart relaxes. Ideally you want your blood pressure below 120/80. Hypertension forces your heart to work harder to pump blood. Your arteries may become narrow or stiff. Having untreated or uncontrolled hypertension can cause heart attack, stroke, kidney disease, and other problems. RISK FACTORS Some risk factors for high blood pressure are controllable. Others are not.  Risk factors you cannot control include:   Race. You may be at higher risk if you are African American.  Age. Risk increases with age.  Gender. Men are at higher risk than women before age 45 years. After age 65, women are at higher risk than men. Risk factors you can control include:  Not getting enough exercise or physical activity.  Being overweight.  Getting too much fat, sugar, calories, or salt in your diet.  Drinking too much alcohol. SIGNS AND SYMPTOMS Hypertension does not usually cause signs or symptoms. Extremely high blood pressure (hypertensive crisis) may cause headache, anxiety, shortness of breath, and nosebleed. DIAGNOSIS To check if you have hypertension, your health care provider will measure your blood pressure while you are seated, with your arm held at the level of your heart. It should be measured at least twice using the same arm. Certain conditions can cause a difference in blood pressure between your right and left arms. A blood pressure reading that is higher than normal on one occasion does not mean that you need treatment. If  it is not clear whether you have high blood pressure, you may be asked to return on a different day to have your blood pressure checked again. Or, you may be asked to monitor your blood pressure at home for 1 or more weeks. TREATMENT Treating high blood pressure includes making lifestyle changes and possibly taking medicine. Living a healthy lifestyle can help lower high blood pressure. You may need to change some of your habits. Lifestyle changes may include:  Following the DASH diet. This diet is high in fruits, vegetables, and whole grains. It is low in salt, red meat, and added sugars.  Keep your sodium intake below 2,300 mg per day.  Getting at least 30-45 minutes of aerobic exercise at least 4 times per week.  Losing weight if necessary.  Not smoking.  Limiting alcoholic beverages.  Learning ways to reduce stress. Your health care provider may prescribe medicine if lifestyle changes are not enough to get your blood pressure under control, and if one of the following is true:  You are 18-59 years of age and your systolic blood pressure is above 140.  You are 60 years of age or older, and your systolic blood pressure is above 150.  Your diastolic blood pressure is above 90.  You have diabetes, and your systolic blood pressure is over 140 or your diastolic blood pressure is over 90.  You have kidney disease and your blood pressure is above 140/90.  You have heart disease and your blood pressure is above 140/90. Your personal target blood pressure may vary depending on your medical conditions, your age, and other factors. HOME CARE INSTRUCTIONS    Have your blood pressure rechecked as directed by your health care provider.   Take medicines only as directed by your health care provider. Follow the directions carefully. Blood pressure medicines must be taken as prescribed. The medicine does not work as well when you skip doses. Skipping doses also puts you at risk for  problems.  Do not smoke.   Monitor your blood pressure at home as directed by your health care provider. SEEK MEDICAL CARE IF:   You think you are having a reaction to medicines taken.  You have recurrent headaches or feel dizzy.  You have swelling in your ankles.  You have trouble with your vision. SEEK IMMEDIATE MEDICAL CARE IF:  You develop a severe headache or confusion.  You have unusual weakness, numbness, or feel faint.  You have severe chest or abdominal pain.  You vomit repeatedly.  You have trouble breathing. MAKE SURE YOU:   Understand these instructions.  Will watch your condition.  Will get help right away if you are not doing well or get worse.   This information is not intended to replace advice given to you by your health care provider. Make sure you discuss any questions you have with your health care provider.   Document Released: 10/17/2005 Document Revised: 03/03/2015 Document Reviewed: 08/09/2013 Elsevier Interactive Patient Education 2016 [employer] Your High Blood Pressure Blood pressure is a measurement of how forceful your blood is pressing against the walls of the arteries. Arteries are muscular tubes within the circulatory system. Blood pressure does not stay the same. Blood pressure rises when you are active, excited, or nervous; and it lowers during sleep and relaxation. If the numbers measuring your blood pressure stay above normal most of the time, you are at risk for health problems. High blood pressure (hypertension) is a long-term (chronic) condition in which blood pressure is elevated. A blood pressure reading is recorded as two numbers, such as 120 over 80 (or 120/80). The first, higher number is called the systolic pressure. It is a measure of the pressure in your arteries as the heart beats. The second, lower number is called the diastolic pressure. It is a measure of the pressure in your arteries as the heart relaxes between  beats.  Keeping your blood pressure in a normal range is important to your overall health and prevention of health problems, such as heart disease and stroke. When your blood pressure is uncontrolled, your heart has to work harder than normal. High blood pressure is a very common condition in adults because blood pressure tends to rise with age. Men and women are equally likely to have hypertension but at different times in life. Before age 77, men are more likely to have hypertension. After 37 years of age, women are more likely to have it. Hypertension is especially common in African Americans. This condition often has no signs or symptoms. The cause of the condition is usually not known. Your caregiver can help you come up with a plan to keep your blood pressure in a normal, healthy range. BLOOD PRESSURE STAGES Blood pressure is classified into four stages: normal, prehypertension, stage 1, and stage 2. Your blood pressure reading will be used to determine what type of treatment, if any, is necessary. Appropriate treatment options are tied to these four stages:  Normal  Systolic pressure (mm Hg): below 120.  Diastolic pressure (mm Hg): below 80. Prehypertension  Systolic pressure (mm Hg): 120 to 139.  Diastolic pressure (mm Hg): 80 to  89. Stage1  Systolic pressure (mm Hg): 140 to 159.  Diastolic pressure (mm Hg): 90 to 99. Stage2  Systolic pressure (mm Hg): 160 or above.  Diastolic pressure (mm Hg): 100 or above. RISKS RELATED TO HIGH BLOOD PRESSURE Managing your blood pressure is an important responsibility. Uncontrolled high blood pressure can lead to:  A heart attack.  A stroke.  A weakened blood vessel (aneurysm).  Heart failure.  Kidney damage.  Eye damage.  Metabolic syndrome.  Memory and concentration problems. HOW TO MANAGE YOUR BLOOD PRESSURE Blood pressure can be managed effectively with lifestyle changes and medicines (if needed). Your caregiver will help  you come up with a plan to bring your blood pressure within a normal range. Your plan should include the following: Education  Read all information provided by your caregivers about how to control blood pressure.  Educate yourself on the latest guidelines and treatment recommendations. New research is always being done to further define the risks and treatments for high blood pressure. Lifestylechanges  Control your weight.  Avoid smoking.  Stay physically active.  Reduce the amount of salt in your diet.  Reduce stress.  Control any chronic conditions, such as high cholesterol or diabetes.  Reduce your alcohol intake. Medicines  Several medicines (antihypertensive medicines) are available, if needed, to bring blood pressure within a normal range. Communication  Review all the medicines you take with your caregiver because there may be side effects or interactions.  Talk with your caregiver about your diet, exercise habits, and other lifestyle factors that may be contributing to high blood pressure.  See your caregiver regularly. Your caregiver can help you create and adjust your plan for managing high blood pressure. RECOMMENDATIONS FOR TREATMENT AND FOLLOW-UP  The following recommendations are based on current guidelines for managing high blood pressure in nonpregnant adults. Use these recommendations to identify the proper follow-up period or treatment option based on your blood pressure reading. You can discuss these options with your caregiver.  Systolic pressure of 120 to 139 or diastolic pressure of 80 to 89: Follow up with your caregiver as directed.  Systolic pressure of 140 to 160 or diastolic pressure of 90 to 100: Follow up with your caregiver within 2 months.  Systolic pressure above 160 or diastolic pressure above 100: Follow up with your caregiver within 1 month.  Systolic pressure above 180 or diastolic pressure above 110: Consider antihypertensive therapy;  follow up with your caregiver within 1 week.  Systolic pressure above 200 or diastolic pressure above 120: Begin antihypertensive therapy; follow up with your caregiver within 1 week.   This information is not intended to replace advice given to you by your health care provider. Make sure you discuss any questions you have with your health care provider.   Document Released: 07/11/2012 Document Reviewed: 07/11/2012 Elsevier Interactive Patient Education 2016 [employer] DASH Eating Plan DASH stands for "Dietary Approaches to Stop Hypertension." The DASH eating plan is a healthy eating plan that has been shown to reduce high blood pressure (hypertension). Additional health benefits may include reducing the risk of type 2 diabetes mellitus, heart disease, and stroke. The DASH eating plan may also help with weight loss. WHAT DO I NEED TO KNOW ABOUT THE DASH EATING PLAN? For the DASH eating plan, you will follow these general guidelines:  Choose foods with a percent daily value for sodium of less than 5% (as listed on the food label).  Use salt-free seasonings or herbs instead of table salt or  sea salt.  Check with your health care provider or pharmacist before using salt substitutes.  Eat lower-sodium products, often labeled as "lower sodium" or "no salt added."  Eat fresh foods.  Eat more vegetables, fruits, and low-fat dairy products.  Choose whole grains. Look for the word "whole" as the first word in the ingredient list.  Choose fish and skinless chicken or [place] more often than red meat. Limit fish, poultry, and meat to 6 oz (170 g) each day.  Limit sweets, desserts, sugars, and sugary drinks.  Choose heart-healthy fats.  Limit cheese to 1 oz (28 g) per day.  Eat more home-cooked food and less restaurant, buffet, and fast food.  Limit fried foods.  Cook foods using methods other than frying.  Limit canned vegetables. If you do use them, rinse them well to decrease the  sodium.  When eating at a restaurant, ask that your food be prepared with less salt, or no salt if possible. WHAT FOODS CAN I EAT? Seek help from a dietitian for individual calorie needs. Grains Whole grain or whole wheat bread. Brown rice. Whole grain or whole wheat pasta. Quinoa, bulgur, and whole grain cereals. Low-sodium cereals. Corn or whole wheat flour tortillas. Whole grain cornbread. Whole grain crackers. Low-sodium crackers. Vegetables Fresh or frozen vegetables (raw, steamed, roasted, or grilled). Low-sodium or reduced-sodium tomato and vegetable juices. Low-sodium or reduced-sodium tomato sauce and paste. Low-sodium or reduced-sodium canned vegetables.  Fruits All fresh, canned (in natural juice), or frozen fruits. Meat and Other Protein Products Ground beef (85% or leaner), grass-fed beef, or beef trimmed of fat. Skinless chicken or [place]. Ground chicken or [place]. Pork trimmed of fat. All fish and seafood. Eggs. Dried beans, peas, or lentils. Unsalted nuts and seeds. Unsalted canned beans. Dairy Low-fat dairy products, such as skim or 1% milk, 2% or reduced-fat cheeses, low-fat ricotta or cottage cheese, or plain low-fat yogurt. Low-sodium or reduced-sodium cheeses. Fats and Oils Tub margarines without trans fats. Light or reduced-fat mayonnaise and salad dressings (reduced sodium). Avocado. Safflower, olive, or canola oils. Natural peanut or almond butter. Other Unsalted popcorn and pretzels. The items listed above may not be a complete list of recommended foods or beverages. Contact your dietitian for more options. WHAT FOODS ARE NOT RECOMMENDED? Grains Acri bread. Marron pasta. Jacob rice. Refined cornbread. Bagels and croissants. Crackers that contain trans fat. Vegetables Creamed or fried vegetables. Vegetables in a cheese sauce. Regular canned vegetables. Regular canned tomato sauce and paste. Regular tomato and vegetable juices. Fruits Dried fruits. Canned fruit in  light or heavy syrup. Fruit juice. Meat and Other Protein Products Fatty cuts of meat. Ribs, chicken wings, bacon, sausage, bologna, salami, chitterlings, fatback, hot dogs, bratwurst, and packaged luncheon meats. Salted nuts and seeds. Canned beans with salt. Dairy Whole or 2% milk, cream, half-and-half, and cream cheese. Whole-fat or sweetened yogurt. Full-fat cheeses or blue cheese. Nondairy creamers and whipped toppings. Processed cheese, cheese spreads, or cheese curds. Condiments Onion and garlic salt, seasoned salt, table salt, and sea salt. Canned and packaged gravies. Worcestershire sauce. Tartar sauce. Barbecue sauce. Teriyaki sauce. Soy sauce, including reduced sodium. Steak sauce. Fish sauce. Oyster sauce. Cocktail sauce. Horseradish. Ketchup and mustard. Meat flavorings and tenderizers. Bouillon cubes. Hot sauce. Tabasco sauce. Marinades. Taco seasonings. Relishes. Fats and Oils Butter, stick margarine, lard, shortening, ghee, and bacon fat. Coconut, palm kernel, or palm oils. Regular salad dressings. Other Pickles and olives. Salted popcorn and pretzels. The items listed above may not be a complete  list of foods and beverages to avoid. Contact your dietitian for more information. WHERE CAN I FIND MORE INFORMATION? National Heart, Lung, and Blood Institute: travelstabloid.com   This information is not intended to replace advice given to you by your health care provider. Make sure you discuss any questions you have with your health care provider.   Document Released: 10/06/2011 Document Revised: 11/07/2014 Document Reviewed: 08/21/2013 Elsevier Interactive Patient Education [employer].

## 2015-09-08 NOTE — Progress Notes (Signed)
Subjective:    Patient ID: Wesley Fox, male    DOB: 12-Oct-1981, 34 y.o.   MRN: [id number]  HPI  [name], a 34 year old male that presents for a hypertension follow up. He reports that he has been taking medications consistently.  He is exercising and is adherent to low salt diet. He does not check blood pressures at home.  Patient denies chest pain, chest pressure/discomfort, dyspnea, fatigue, near-syncope, orthopnea, palpitations and tachypnea.   Patient is also  involving the  right knee. Onset of the symptoms was about a month ago.  He states that he notices increased pain when at the gym or dancing. Symptoms include pain located in the left lateral aspect of right knee, stiffness and swelling. Pain is aggravated by kneeling, lateral movements, pivoting, running, standing and walking.  Patient has had no prior knee problems. He maintains that he is taking meloxicam 7.5 mg daily consistently with mild to moderate relief.  Past Medical History  Diagnosis Date  . Hypertension    Social History   Social History  . Marital Status: Single    Spouse Name: N/A  . Number of Children: N/A  . Years of Education: N/A   Occupational History  . Not on file.   Social History Main Topics  . Smoking status: Former Smoker    Quit date: 06/29/2014  . Smokeless tobacco: Not on file  . Alcohol Use: No  . 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 Sexual Activity: Not on file   Other Topics Concern  . Not on file   Social History Narrative   Immunization History  Administered Date(s) Administered  . Influenza,inj,Quad PF,36+ Mos 08/06/2015  . Tdap 08/06/2015  No Known Allergies Review of Systems  Constitutional: Negative for fatigue.  HENT: Negative.   Eyes: Negative.   Respiratory: Negative.   Cardiovascular: Negative.   Endocrine: Negative.   Genitourinary: Negative.   Musculoskeletal: Positive for myalgias (right knee pain and left forearm  pain).  Skin: Negative.   Allergic/Immunologic: Negative.   Neurological: Negative.   Hematological: Negative.   Psychiatric/Behavioral: Negative.        Objective:   Physical Exam  Constitutional: He appears well-developed and well-nourished.  HENT:  Head: Normocephalic and atraumatic.  Right Ear: External ear normal.  Left Ear: External ear normal.  Mouth/Throat: Oropharynx is clear and moist.  Eyes: Conjunctivae and EOM are normal. Pupils are equal, round, and reactive to light.  Neck: Normal range of motion. Neck supple.  Cardiovascular: Normal rate, regular rhythm, normal heart sounds and intact distal pulses.   Pulmonary/Chest: Effort normal and breath sounds normal.  Abdominal: Soft. Bowel sounds are normal.  Musculoskeletal:       Left wrist: He exhibits swelling. He exhibits normal range of motion.       Right knee: He exhibits decreased range of motion and swelling. He exhibits no erythema and normal meniscus. Tenderness found. Lateral joint line tenderness noted. No patellar tendon tenderness noted.  Skin: Skin is warm and dry.  Psychiatric: He has a normal mood and affect. His behavior is normal. Judgment and thought content normal.      BP 133/86 mmHg  Pulse 81  Temp(Src) 98 F (36.7 C) (Oral)  Resp 20  Ht  (1.854 m)  Wt 251 lb (113.853 kg)  BMI 33.12 kg/m2  SpO2 97% Assessment & Plan:   1. Essential hypertension Blood pressure is at goal on  current medication regimen. Will continue medication at current dosage. Reviewed previous labs.   2. Right knee pain Reviewed previous labs, sedimentation rate within normal limits. He denies previous injury to right knee. Will Continue Meloxicam at current dosage. Tenderness and trace edema to right lateral knee. Recommend ice therapy 20 minutes 4 times per day followed by warm moist compresses. Recommend that patient follow up with Sports Medicine.  Elevate right knee to heart level while at rest.   3. Left  forearm pain Patient had left arm surgery in 2013. He states that he has periodic swelling and pain. He was evaluated by Sports Medicine, who recommended a referral to orthopedics where they will obtain and xray and consider surgical exploration.   RTC: 6 months for hypertension  The patient was given clear instructions to go to ER or return to medical center if symptoms do not improve, worsen or new problems develop. The patient verbalized understanding. Will notify patient with laboratory results.  Massie MaroonHollis,Lachina M, FNP

## 2015-09-09 ENCOUNTER — Encounter: Payer: Self-pay | Admitting: Family Medicine

## 2015-10-29 ENCOUNTER — Encounter (HOSPITAL_COMMUNITY): Payer: Self-pay

## 2015-10-29 ENCOUNTER — Emergency Department (HOSPITAL_COMMUNITY)
Admission: EM | Admit: 2015-10-29 | Discharge: 2015-10-29 | Disposition: A | Discharge status: Home / Self Care | Payer: Self-pay | Attending: Emergency Medicine | Admitting: Emergency Medicine

## 2015-10-29 DIAGNOSIS — R197 Diarrhea, unspecified: Secondary | ICD-10-CM

## 2015-10-29 DIAGNOSIS — Z87891 Personal history of nicotine dependence: Secondary | ICD-10-CM | POA: Insufficient documentation

## 2015-10-29 DIAGNOSIS — I10 Essential (primary) hypertension: Secondary | ICD-10-CM | POA: Insufficient documentation

## 2015-10-29 DIAGNOSIS — Z79899 Other long term (current) drug therapy: Secondary | ICD-10-CM | POA: Insufficient documentation

## 2015-10-29 DIAGNOSIS — B349 Viral infection, unspecified: Secondary | ICD-10-CM | POA: Insufficient documentation

## 2015-10-29 LAB — CBC WITH DIFFERENTIAL/PLATELET
Basophils Absolute: 0 10*3/uL (ref 0.0–0.1)
Basophils Relative: 0 %
Eosinophils Absolute: 0.1 10*3/uL (ref 0.0–0.7)
Eosinophils Relative: 1 %
HEMATOCRIT: 38.9 % — AB (ref 39.0–52.0)
Hemoglobin: 13.2 g/dL (ref 13.0–17.0)
LYMPHS ABS: 2.7 10*3/uL (ref 0.7–4.0)
LYMPHS PCT: 29 %
MCH: 27.7 pg (ref 26.0–34.0)
MCHC: 33.9 g/dL (ref 30.0–36.0)
MCV: 81.7 fL (ref 78.0–100.0)
Monocytes Absolute: 0.8 10*3/uL (ref 0.1–1.0)
Monocytes Relative: 8 %
NEUTROS PCT: 62 %
Neutro Abs: 5.8 10*3/uL (ref 1.7–7.7)
Platelets: 231 10*3/uL (ref 150–400)
RBC: 4.76 MIL/uL (ref 4.22–5.81)
RDW: 13.8 % (ref 11.5–15.5)
WBC: 9.4 10*3/uL (ref 4.0–10.5)

## 2015-10-29 LAB — BASIC METABOLIC PANEL
Anion gap: 8 (ref 5–15)
BUN: 9 mg/dL (ref 6–20)
CALCIUM: 9.2 mg/dL (ref 8.9–10.3)
CO2: 24 mmol/L (ref 22–32)
CREATININE: 1.17 mg/dL (ref 0.61–1.24)
Chloride: 103 mmol/L (ref 101–111)
GFR calc Af Amer: >60 mL/min (ref >60)
GFR calc non Af Amer: >60 mL/min (ref >60)
GLUCOSE: 153 mg/dL — AB (ref 65–99)
Potassium: 3.2 mmol/L — ABNORMAL LOW (ref 3.5–5.1)
Sodium: 135 mmol/L (ref 135–145)

## 2015-10-29 LAB — RAPID STREP SCREEN (MED CTR MEBANE ONLY): Streptococcus, Group A Screen (Direct): NEGATIVE

## 2015-10-29 MED ORDER — SODIUM CHLORIDE 0.9 % IV BOLUS (SEPSIS)
1000.0000 mL | Freq: Once | INTRAVENOUS | Status: AC
  Administered 2015-10-29: 1000 mL via INTRAVENOUS

## 2015-10-29 MED ORDER — POTASSIUM CHLORIDE CRYS ER 20 MEQ PO TBCR
40.0000 meq | EXTENDED_RELEASE_TABLET | Freq: Once | ORAL | Status: AC
  Administered 2015-10-29: 40 meq via ORAL
  Filled 2015-10-29: qty 2

## 2015-10-29 NOTE — Discharge Instructions (Signed)
Use OTC pain relievers tylenol and motrin as needed. You may use chloraseptic spray for your sore throat which can be bought over the counter.    Viral Infections A viral infection can be caused by different types of viruses.Most viral infections are not serious and resolve on their own. However, some infections may cause severe symptoms and may lead to further complications. SYMPTOMS Viruses can frequently cause:  Minor sore throat.  Aches and pains.  Headaches.  Runny nose.  Different types of rashes.  Watery eyes.  Tiredness.  Cough.  Loss of appetite.  Gastrointestinal infections, resulting in nausea, vomiting, and diarrhea. These symptoms do not respond to antibiotics because the infection is not caused by bacteria. However, you might catch a bacterial infection following the viral infection. This is sometimes called a "superinfection." Symptoms of such a bacterial infection may include:  Worsening sore throat with pus and difficulty swallowing.  Swollen neck glands.  Chills and a high or persistent fever.  Severe headache.  Tenderness over the sinuses.  Persistent overall ill feeling (malaise), muscle aches, and tiredness (fatigue).  Persistent cough.  Yellow, green, or brown mucus production with coughing. HOME CARE INSTRUCTIONS   Only take over-the-counter or prescription medicines for pain, discomfort, diarrhea, or fever as directed by your caregiver.  Drink enough water and fluids to keep your urine clear or pale yellow. Sports drinks can provide valuable electrolytes, sugars, and hydration.  Get plenty of rest and maintain proper nutrition. Soups and broths with crackers or rice are fine. SEEK IMMEDIATE MEDICAL CARE IF:   You have severe headaches, shortness of breath, chest pain, neck pain, or an unusual rash.  You have uncontrolled vomiting, diarrhea, or you are unable to keep down fluids.  You or your child has an oral temperature above 102 F  (38.9 C), not controlled by medicine.  Your baby is older than 3 months with a rectal temperature of 102 F (38.9 C) or higher.  Your baby is 40 months old or younger with a rectal temperature of 100.4 F (38 C) or higher. MAKE SURE YOU:   Understand these instructions.  Will watch your condition.  Will get help right away if you are not doing well or get worse.   This information is not intended to replace advice given to you by your health care provider. Make sure you discuss any questions you have with your health care provider.   Document Released: 07/27/2005 Document Revised: 01/09/2012 Document Reviewed: 03/25/2015 Elsevier Interactive Patient Education 2016 [employer]   Diarrhea Diarrhea is frequent loose and watery bowel movements. It can cause you to feel weak and dehydrated. Dehydration can cause you to become tired and thirsty, have a dry mouth, and have decreased urination that often is dark yellow. Diarrhea is a sign of another problem, most often an infection that will not last long. In most cases, diarrhea typically lasts 2-3 days. However, it can last longer if it is a sign of something more serious. It is important to treat your diarrhea as directed by your caregiver to lessen or prevent future episodes of diarrhea. CAUSES  Some common causes include:  Gastrointestinal infections caused by viruses, bacteria, or parasites.  Food poisoning or food allergies.  Certain medicines, such as antibiotics, chemotherapy, and laxatives.  Artificial sweeteners and fructose.  Digestive disorders. HOME CARE INSTRUCTIONS  Ensure adequate fluid intake (hydration): Have 1 cup (8 oz) of fluid for each diarrhea episode. Avoid fluids that contain simple sugars or sports drinks,  fruit juices, whole milk products, and sodas. Your urine should be clear or pale yellow if you are drinking enough fluids. Hydrate with an oral rehydration solution that you can purchase at pharmacies,  retail stores, and online. You can prepare an oral rehydration solution at home by mixing the following ingredients together:   - tsp table salt.   tsp baking soda.   tsp salt substitute containing potassium chloride.  1  tablespoons sugar.  1 L (34 oz) of water.  Certain foods and beverages may increase the speed at which food moves through the gastrointestinal (GI) tract. These foods and beverages should be avoided and include:  Caffeinated and alcoholic beverages.  High-fiber foods, such as raw fruits and vegetables, nuts, seeds, and whole grain breads and cereals.  Foods and beverages sweetened with sugar alcohols, such as xylitol, sorbitol, and mannitol.  Some foods may be well tolerated and may help thicken stool including:  Starchy foods, such as rice, toast, pasta, low-sugar cereal, oatmeal, grits, baked potatoes, crackers, and bagels.  Bananas.  Applesauce.  Add probiotic-rich foods to help increase healthy bacteria in the GI tract, such as yogurt and fermented milk products.  Wash your hands well after each diarrhea episode.  Only take over-the-counter or prescription medicines as directed by your caregiver.  Take a warm bath to relieve any burning or pain from frequent diarrhea episodes. SEEK IMMEDIATE MEDICAL CARE IF:   You are unable to keep fluids down.  You have persistent vomiting.  You have blood in your stool, or your stools are black and tarry.  You do not urinate in 6-8 hours, or there is only a small amount of very dark urine.  You have abdominal pain that increases or localizes.  You have weakness, dizziness, confusion, or light-headedness.  You have a severe headache.  Your diarrhea gets worse or does not get better.  You have a fever or persistent symptoms for more than 2-3 days.  You have a fever and your symptoms suddenly get worse. MAKE SURE YOU:   Understand these instructions.  Will watch your condition.  Will get help right  away if you are not doing well or get worse.   This information is not intended to replace advice given to you by your health care provider. Make sure you discuss any questions you have with your health care provider.   Document Released: 10/07/2002 Document Revised: 11/07/2014 Document Reviewed: 06/24/2012 Elsevier Interactive Patient Education [employer] Inc[employer]
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
## 2015-10-29 NOTE — ED Notes (Signed)
Pt here c/o sore throat, diarrhea, and cough X4 days. He reports he had a fever a home. Pt reports using OTC medications but still does not feel well.

## 2015-10-31 LAB — CULTURE, GROUP A STREP

## 2015-11-06 MED FILL — LISINOPRIL-HCTZ 10-12.5 MG: 10-12.5 | 30 days supply | Qty: 30 | Fill #2

## 2015-12-10 MED FILL — LISINOPRIL-HCTZ 10-12.5 MG: 10-12.5 | 30 days supply | Qty: 30 | Fill #3

## 2016-01-15 MED FILL — LISINOPRIL-HCTZ 10-12.5 MG: 10-12.5 | 30 days supply | Qty: 30 | Fill #4 | Status: TO

## 2016-03-08 ENCOUNTER — Ambulatory Visit: Payer: Self-pay | Admitting: Family Medicine
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
